# Patient Record
Sex: Male | Born: 1965 | Race: Black or African American | Smoking: Current every day smoker
Health system: Southern US, Community
[De-identification: ages and names within clinical notes are randomized; demographics above are authoritative.]

---

## 2013-05-09 ENCOUNTER — Other Ambulatory Visit (HOSPITAL_COMMUNITY): Payer: Self-pay | Admitting: Radiology

## 2013-05-09 ENCOUNTER — Ambulatory Visit (HOSPITAL_COMMUNITY): Payer: BC Managed Care – PPO | Attending: Orthopedic Surgery | Admitting: Radiology

## 2013-05-09 DIAGNOSIS — Z01810 Encounter for preprocedural cardiovascular examination: Secondary | ICD-10-CM

## 2013-05-09 DIAGNOSIS — R011 Cardiac murmur, unspecified: Secondary | ICD-10-CM

## 2013-05-09 NOTE — Progress Notes (Signed)
Echocardiogram performed.  

## 2013-05-13 ENCOUNTER — Telehealth: Payer: Self-pay | Admitting: *Deleted

## 2013-05-13 DIAGNOSIS — I77810 Thoracic aortic ectasia: Secondary | ICD-10-CM

## 2013-05-13 DIAGNOSIS — I350 Nonrheumatic aortic (valve) stenosis: Secondary | ICD-10-CM

## 2013-05-13 NOTE — Telephone Encounter (Signed)
Message copied by Barrie Folk on Mon May 13, 2013  9:29 AM ------      Message from: Laurey Morale      Created: Sun May 12, 2013  8:45 PM       Dilated ascending aorta with mild AS.  Needs CTA chest to further assess ascending aorta.  Does he have cardiology followup? ------

## 2013-05-13 NOTE — Telephone Encounter (Signed)
I spoke with pt about results of ECHO. He has a follow-up appointment with Dr. Eldridge Dace on 05/21/13 Charmaine notified for prior approval. We discussed the CTA. He will have that performed tomorrow in our office at 3:30pm Pt is aware he needs to be NPO  For 2 hours prior to CTA.   Mylo Red RN

## 2013-05-14 ENCOUNTER — Ambulatory Visit (INDEPENDENT_AMBULATORY_CARE_PROVIDER_SITE_OTHER)
Admission: RE | Admit: 2013-05-14 | Discharge: 2013-05-14 | Disposition: A | Discharge status: Home / Self Care | Payer: BC Managed Care – PPO | Source: Ambulatory Visit | Attending: Cardiology | Admitting: Cardiology

## 2013-05-14 ENCOUNTER — Telehealth: Payer: Self-pay | Admitting: *Deleted

## 2013-05-14 DIAGNOSIS — I77810 Thoracic aortic ectasia: Secondary | ICD-10-CM

## 2013-05-14 DIAGNOSIS — I350 Nonrheumatic aortic (valve) stenosis: Secondary | ICD-10-CM

## 2013-05-14 DIAGNOSIS — I359 Nonrheumatic aortic valve disorder, unspecified: Secondary | ICD-10-CM

## 2013-05-14 MED ORDER — IOHEXOL 350 MG/ML SOLN
100.0000 mL | Freq: Once | INTRAVENOUS | Status: AC | PRN
  Administered 2013-05-14: 100 mL via INTRAVENOUS

## 2013-05-14 NOTE — Telephone Encounter (Signed)
Tried calling Dr. Rachael Fee  office as per notes. Couldn't get an answer.

## 2013-05-21 ENCOUNTER — Ambulatory Visit (INDEPENDENT_AMBULATORY_CARE_PROVIDER_SITE_OTHER): Payer: BC Managed Care – PPO | Admitting: Interventional Cardiology

## 2013-05-21 ENCOUNTER — Encounter: Payer: Self-pay | Admitting: Interventional Cardiology

## 2013-05-21 ENCOUNTER — Encounter: Payer: Self-pay | Admitting: Cardiology

## 2013-05-21 VITALS — BP 168/92 | HR 100 | Ht 73.0 in | Wt 289.8 lb

## 2013-05-21 DIAGNOSIS — R9431 Abnormal electrocardiogram [ECG] [EKG]: Secondary | ICD-10-CM

## 2013-05-21 DIAGNOSIS — I712 Thoracic aortic aneurysm, without rupture, unspecified: Secondary | ICD-10-CM

## 2013-05-21 DIAGNOSIS — R011 Cardiac murmur, unspecified: Secondary | ICD-10-CM

## 2013-05-21 DIAGNOSIS — I10 Essential (primary) hypertension: Secondary | ICD-10-CM

## 2013-05-21 DIAGNOSIS — E669 Obesity, unspecified: Secondary | ICD-10-CM

## 2013-05-21 NOTE — Progress Notes (Signed)
Patient ID: Jason Miles, male   DOB: [birth year], 47 y.o.   MRN: [id number]     Patient ID: Jason Miles MRN: [id number] DOB/AGE: 01/18/1966 47 y.o.   Referring Physician Dr. Rennis Chris   Reason for Consultation preoperative clearance  HPI: 48 y/o who had shoulder surgery planned.  It was cancelled due to a heart murmur.  He had an echocardiogram showing mild AS and a dilated aortic root.  He smokes and is hoping to quit.  He failed chantix. He walks stairs and has no problems with that activity.    No exercise recently due to shoulder injury.  BP has ben high but he does not check at home.  He thinks his BP is higher in the MDs office than at home.     Current Outpatient Prescriptions  Medication Sig Dispense Refill  . amLODipine (NORVASC) 10 MG tablet Take 10 mg by mouth daily.       No current facility-administered medications for this visit.   No past medical history on file.  No family history on file.  History   Social History  . Marital Status: Divorced    Spouse Name: N/A    Number of Children: N/A  . Years of Education: N/A   Occupational History  . Not on file.   Social History Main Topics  . Smoking status: Not on file  . Smokeless tobacco: Not on file  . Alcohol Use: Not on file  . Drug Use: Not on file  . Sexual Activity: Not on file   Other Topics Concern  . Not on file   Social History Narrative  . No narrative on file    No past surgical history on file.    (Not in a hospital admission)  Review of systems complete and found to be negative unless listed above .  No nausea, vomiting.  No fever chills, No focal weakness,  No palpitations.  Physical Exam: Filed Vitals:   05/21/13 1426  BP: 168/92  Pulse: 100    Weight: 289 lb 12.8 oz (131.452 kg)  Physical exam:  [place]/AT EOMI No JVD, No carotid bruit RRR S1S2 , 2/6 systolic murmur No wheezing Soft. NT, nondistended No edema. No focal motor or sensory deficits Normal affect  Labs:   No  results found for this basename: WBC, HGB, HCT, MCV, PLT   No results found for this basename: NA, K, CL, CO2, BUN, CREATININE, CALCIUM, LABALBU, PROT, BILITOT, ALKPHOS, ALT, AST, GLUCOSE,  in the last 168 hours No results found for this basename: CKTOTAL, CKMB, CKMBINDEX, TROPONINI    No results found for this basename: CHOL   No results found for this basename: HDL   No results found for this basename: LDLCALC   No results found for this basename: TRIG   No results found for this basename: CHOLHDL   No results found for this basename: LDLDIRECT      Radiology:CT scan shows 4.3 cm ascending aortic aneurysm EKG: NSR, TWI laterally  ASSESSMENT AND PLAN:  Preoperative evaluation:  No further cardiac testing needed before surgery.  No ischemic sx with walking stairs.    Murmur: Aortic stenosis- mild by echo.    Dilated aortic root: Mild.  No need for surgery at this time.  Will need routine f/u.  Needs aggressive BP control.  Abnormal ECG: LVH noted on echo.  ECG changes are consistent with this.  HTN: Weight loss would be beneficial.  Check BP outside of the doctors office.   May  need to add ACE-I if BP continues to be high.   Signed:   Fredric Mare, MD, [facility] 05/21/2013, 2:41 PM

## 2013-05-21 NOTE — Patient Instructions (Addendum)
Your physician has requested that you regularly monitor and record your blood pressure readings at home about 3 times a week. Call if BP consistently stays elevated about 140/90. Please use the same machine at the same time of day to check your readings and record them to bring to your follow-up visit.  Your physician recommends that you schedule a follow-up appointment in: 3 weeks with Dr. Eldridge Dace.

## 2013-05-22 DIAGNOSIS — I712 Thoracic aortic aneurysm, without rupture: Secondary | ICD-10-CM | POA: Insufficient documentation

## 2013-05-22 DIAGNOSIS — I10 Essential (primary) hypertension: Secondary | ICD-10-CM | POA: Insufficient documentation

## 2013-05-22 DIAGNOSIS — R011 Cardiac murmur, unspecified: Secondary | ICD-10-CM | POA: Insufficient documentation

## 2013-05-22 DIAGNOSIS — R9431 Abnormal electrocardiogram [ECG] [EKG]: Secondary | ICD-10-CM | POA: Insufficient documentation

## 2013-05-22 DIAGNOSIS — E669 Obesity, unspecified: Secondary | ICD-10-CM | POA: Insufficient documentation

## 2013-06-13 ENCOUNTER — Ambulatory Visit (INDEPENDENT_AMBULATORY_CARE_PROVIDER_SITE_OTHER): Payer: BC Managed Care – PPO | Admitting: Interventional Cardiology

## 2013-06-13 ENCOUNTER — Encounter (INDEPENDENT_AMBULATORY_CARE_PROVIDER_SITE_OTHER): Payer: Self-pay

## 2013-06-13 ENCOUNTER — Encounter: Payer: Self-pay | Admitting: Interventional Cardiology

## 2013-06-13 VITALS — BP 142/92 | HR 92 | Ht 73.0 in | Wt 293.0 lb

## 2013-06-13 DIAGNOSIS — I359 Nonrheumatic aortic valve disorder, unspecified: Secondary | ICD-10-CM

## 2013-06-13 DIAGNOSIS — I712 Thoracic aortic aneurysm, without rupture, unspecified: Secondary | ICD-10-CM

## 2013-06-13 DIAGNOSIS — I10 Essential (primary) hypertension: Secondary | ICD-10-CM

## 2013-06-13 DIAGNOSIS — E669 Obesity, unspecified: Secondary | ICD-10-CM

## 2013-06-13 MED ORDER — LISINOPRIL 20 MG PO TABS: ORAL_TABLET | ORAL | Status: DC

## 2013-06-13 MED ORDER — AMLODIPINE BESYLATE 10 MG PO TABS: 10.0000 mg | ORAL_TABLET | Freq: Every day | ORAL | Status: DC

## 2013-06-13 NOTE — Progress Notes (Signed)
Patient ID: Jason Miles, male   DOB: [birth year], 48 y.o.   MRN: [id number] Patient ID: Jason BoehringerHenry Miles, male   DOB: [birth year], 48 y.o.   MRN: [id number]     Patient ID: Jason BoehringerHenry Miles MRN: [id number] DOB/AGE: [birth year] 48 y.o.   Referring Physician Dr. Rennis ChrisSupple   Reason for Consultation preoperative clearance  HPI: 48 y/o who had shoulder surgery planned.  It was cancelled due to a heart murmur.  He had an echocardiogram showing mild AS and a dilated aortic root.  He smokes and is hoping to quit.  He failed chantix. He walks stairs and has no problems with that activity.    No exercise recently due to shoulder injury.  BP has ben high but he does not check at home.  He thinks his BP is higher in the MDs office than at home.  Blood pressure medicines were changed. It does appear that he is taking amlodipine 10 mg twice a day. The second dose may not be that effective. An ACE inhibitor was started.   Current Outpatient Prescriptions  Medication Sig Dispense Refill  . amLODipine (NORVASC) 10 MG tablet Take 10 mg by mouth 2 (two) times daily.       Marland Kitchen. lisinopril (PRINIVIL,ZESTRIL) 10 MG tablet Take 10 mg by mouth daily.        No current facility-administered medications for this visit.   No past medical history on file.  Family History  Problem Relation Age of Onset  . Diabetes Mother   . Diabetes Father   . Diabetes Sister   . Diabetes Brother     History   Social History  . Marital Status: Divorced    Spouse Name: N/A    Number of Children: N/A  . Years of Education: N/A   Occupational History  . Not on file.   Social History Main Topics  . Smoking status: Current Every Day Smoker -- 0.50 packs/day for 25 years  . Smokeless tobacco: Not on file  . Alcohol Use: Not on file  . Drug Use: Not on file  . Sexual Activity: Not on file   Other Topics Concern  . Not on file   Social History Narrative  . No narrative on file    No past surgical history on file.    (Not in a  hospital admission)  Review of systems complete and found to be negative unless listed above .  No nausea, vomiting.  No fever chills, No focal weakness,  No palpitations.  Physical Exam: Filed Vitals:   06/13/13 1436  BP: 142/92  Pulse: 92    Weight: 293 lb (132.904 kg)  Physical exam:  [place]/AT EOMI No JVD, No carotid bruit RRR S1S2 , 2/6 systolic murmur No wheezing Soft. NT, nondistended No edema. No focal motor or sensory deficits Normal affect  Labs:   No results found for this basename: WBC,  HGB,  HCT,  MCV,  PLT   No results found for this basename: NA, K, CL, CO2, BUN, CREATININE, CALCIUM, LABALBU, PROT, BILITOT, ALKPHOS, ALT, AST, GLUCOSE,  in the last 168 hours No results found for this basename: CKTOTAL,  CKMB,  CKMBINDEX,  TROPONINI    No results found for this basename: CHOL   No results found for this basename: HDL   No results found for this basename: LDLCALC   No results found for this basename: TRIG   No results found for this basename: CHOLHDL   No results found for this basename: LDLDIRECT  Radiology:CT scan shows 4.3 cm ascending aortic aneurysm EKG: NSR, TWI laterally  ASSESSMENT AND PLAN:  Preoperative evaluation:  No further cardiac testing needed before surgery.  No ischemic sx with walking stairs.  Shoulder surgery is tomorrow  Murmur: Aortic stenosis- mild by echo.    Dilated aortic root: Mild.  No need for surgery at this time.  Will need routine f/u.  Needs aggressive BP control.  Needs f/u imaging in 1 year, Dec 2015 to look at aortic root.  Abnormal ECG: LVH noted on echo.  ECG changes are consistent with this.  HTN: Weight loss would be beneficial.  Check BP outside of the doctors office.   Lisinopril   was added a couple of weeks ago.  I would decrease amlodipine to 10 mg daily.  Can increase lisinopril to 20 mg daily for better BP control.   Signed:   Fredric Mare, MD, [facility] 06/13/2013, 3:05 PM

## 2013-06-13 NOTE — Patient Instructions (Signed)
Your physician has recommended you make the following change in your medication:   1. Decrease Amlodipine to $RemoveBefore EID_[id number]$10mgYour physician wants you to follow-up in: 3 months with Dr. Eldridge DaceVaranasi. You will receive a reminder letter in the mail two months in advance. If you don't receive a letter, please call our office to schedule the follow-up appointment.

## 2013-10-11 ENCOUNTER — Ambulatory Visit (INDEPENDENT_AMBULATORY_CARE_PROVIDER_SITE_OTHER): Payer: BC Managed Care – PPO | Admitting: Interventional Cardiology

## 2013-10-11 ENCOUNTER — Encounter: Payer: Self-pay | Admitting: Interventional Cardiology

## 2013-10-11 VITALS — BP 142/90 | HR 90 | Ht 73.0 in | Wt 297.0 lb

## 2013-10-11 DIAGNOSIS — I359 Nonrheumatic aortic valve disorder, unspecified: Secondary | ICD-10-CM

## 2013-10-11 DIAGNOSIS — Z79899 Other long term (current) drug therapy: Secondary | ICD-10-CM

## 2013-10-11 DIAGNOSIS — I10 Essential (primary) hypertension: Secondary | ICD-10-CM

## 2013-10-11 DIAGNOSIS — I712 Thoracic aortic aneurysm, without rupture, unspecified: Secondary | ICD-10-CM

## 2013-10-11 MED ORDER — LISINOPRIL 40 MG PO TABS: ORAL_TABLET | ORAL | Status: DC

## 2013-10-11 NOTE — Patient Instructions (Signed)
Your physician recommends that you return for lab work in: 1 week on 10/18/13 for BMET.  Your physician has recommended you make the following change in your medication:   1. Increase Lisinopril to 40 mg daily.   Your physician recommends that you schedule a follow-up appointment in: 4 months with Dr. Eldridge Dace.

## 2013-10-11 NOTE — Progress Notes (Signed)
Patient ID: Jason Miles, male   DOB: April 14, 1966, 48 y.o.   MRN: [id number] Patient ID: Jason Miles, male   DOB: April 14, 1966, 48 y.o.   MRN: [id number] Patient ID: Jason Miles, male   DOB: April 14, 1966, 48 y.o.   MRN: [id number]     Patient ID: Jason Miles MRN: [id number] DOB/AGE: April 14, 1966 48 y.o.   Referring Physician Dr. Rennis ChrisSupple   Reason for Consultation preoperative clearance  HPI: 48 y/o who had shoulder surgery planned.  It was cancelled due to a heart murmur.  He had an echocardiogram showing mild AS and a dilated aortic root.  He smokes and is hoping to quit.  He failed chantix. He walks stairs and has no problems with that activity.    He had his shoulder surgery a few months ago.  He thinks his BP has been in the 140-150 range sine that time.  No exercise recently due to knee pain.  BP has been high at the dentist office  but he does not check at home.     Current Outpatient Prescriptions  Medication Sig Dispense Refill  . amLODipine (NORVASC) 10 MG tablet Take 1 tablet (10 mg total) by mouth daily.  30 tablet  6  . amoxicillin (AMOXIL) 500 MG tablet Take 500 mg by mouth 2 (two) times daily.      Marland Kitchen. aspirin 81 MG tablet Take 81 mg by mouth daily.      . Hydrocodone-Acetaminophen 5-300 MG TABS Take 5-300 tablets by mouth 2 (two) times daily.      Marland Kitchen. lisinopril (PRINIVIL,ZESTRIL) 20 MG tablet 1 tablet by mouth daily  30 tablet  6  . naproxen (NAPROSYN) 500 MG tablet Take 500 mg by mouth 2 (two) times daily with a meal.      . Omega-3 Fatty Acids (FISH OIL) 1000 MG CAPS Take 1,000 capsules by mouth 2 (two) times daily.       No current facility-administered medications for this visit.   No past medical history on file.  Family History  Problem Relation Age of Onset  . Diabetes Mother   . Diabetes Father   . Diabetes Sister   . Diabetes Brother     History   Social History  . Marital Status: Divorced    Spouse Name: N/A    Number of Children: N/A  . Years of Education: N/A    Occupational History  . Not on file.   Social History Main Topics  . Smoking status: Current Every Day Smoker -- 0.50 packs/day for 25 years  . Smokeless tobacco: Not on file  . Alcohol Use: Not on file  . Drug Use: Not on file  . Sexual Activity: Not on file   Other Topics Concern  . Not on file   Social History Narrative  . No narrative on file    No past surgical history on file.    (Not in a hospital admission)  Review of systems complete and found to be negative unless listed above .  No nausea, vomiting.  No fever chills, No focal weakness,  No palpitations.  Physical Exam: Filed Vitals:   10/11/13 1621  BP: 142/90  Pulse: 90    Weight: 297 lb (134.718 kg)  Physical exam:  [place]/AT EOMI No JVD, No carotid bruit RRR S1S2 , 2/6 systolic murmur No wheezing Soft. NT, nondistended No edema. No focal motor or sensory deficits Normal affect  Labs:   No results found for this basename: WBC,  HGB,  HCT,  MCV,  PLT   No results found for this basename: NA, K, CL, CO2, BUN, CREATININE, CALCIUM, LABALBU, PROT, BILITOT, ALKPHOS, ALT, AST, GLUCOSE,  in the last 168 hours No results found for this basename: CKTOTAL,  CKMB,  CKMBINDEX,  TROPONINI    No results found for this basename: CHOL   No results found for this basename: HDL   No results found for this basename: LDLCALC   No results found for this basename: TRIG   No results found for this basename: CHOLHDL   No results found for this basename: LDLDIRECT      Radiology:CT scan shows 4.3 cm ascending aortic aneurysm EKG: NSR, TWI laterally  ASSESSMENT AND PLAN:   Tolerated shoulder surgery.  Murmur: Aortic stenosis- mild by echo.    Dilated aortic root: Mild.  No need for surgery at this time.  Will need routine f/u.  Needs aggressive BP control.  Needs f/u imaging in 1 year, Dec 2015 to look at aortic root.  Abnormal ECG: LVH noted on echo.  ECG changes are consistent with this.  HTN: Weight loss  would be beneficial.  Check BP outside of the doctors office.   Lisinopril   was added a couple of weeks ago.  Continue amlodipine  10 mg daily.  Increase lisinopril to 40 mg daily for better BP control.    BMet in one week. Recheck 144/84.  Signed:   Fredric Mare, MD, [facility] 10/11/2013, 4:50 PM

## 2013-10-18 ENCOUNTER — Other Ambulatory Visit (INDEPENDENT_AMBULATORY_CARE_PROVIDER_SITE_OTHER): Payer: BC Managed Care – PPO

## 2013-10-18 DIAGNOSIS — Z79899 Other long term (current) drug therapy: Secondary | ICD-10-CM

## 2013-10-18 LAB — BASIC METABOLIC PANEL
BUN: 12 mg/dL (ref 6–23)
CHLORIDE: 106 meq/L (ref 96–112)
CO2: 25 meq/L (ref 19–32)
CREATININE: 0.9 mg/dL (ref 0.4–1.5)
Calcium: 9.5 mg/dL (ref 8.4–10.5)
GFR: 119.55 mL/min (ref >60.00)
Glucose, Bld: 94 mg/dL (ref 70–99)
Potassium: 3.5 mEq/L (ref 3.5–5.1)
Sodium: 139 mEq/L (ref 135–145)

## 2013-10-23 ENCOUNTER — Telehealth: Payer: Self-pay | Admitting: Cardiovascular Disease

## 2013-10-23 NOTE — Telephone Encounter (Signed)
Patient was given a script for Crestor

## 2014-01-13 ENCOUNTER — Telehealth: Payer: Self-pay | Admitting: Interventional Cardiology

## 2014-01-13 DIAGNOSIS — R079 Chest pain, unspecified: Secondary | ICD-10-CM

## 2014-01-13 DIAGNOSIS — R0600 Dyspnea, unspecified: Secondary | ICD-10-CM

## 2014-01-13 NOTE — Telephone Encounter (Signed)
LMTCB

## 2014-01-13 NOTE — Telephone Encounter (Signed)
New Message  Pt called states that he has Pain on his left side// pain going down left arm onto left hand// and that he is breaking out into hives// please call back to assist

## 2014-01-14 MED ORDER — IRBESARTAN 150 MG PO TABS: 150.0000 mg | ORAL_TABLET | Freq: Every day | ORAL | Status: DC

## 2014-01-14 MED ORDER — AMLODIPINE BESYLATE 10 MG PO TABS: 10.0000 mg | ORAL_TABLET | Freq: Every day | ORAL | Status: AC

## 2014-01-14 NOTE — Telephone Encounter (Signed)
COntinue to monitor.  Was this the same arm that was operated on?  Any problems with exertion?

## 2014-01-14 NOTE — Telephone Encounter (Signed)
Pt notified, meds updated and [facility] will call pt to schedule Gxt.

## 2014-01-14 NOTE — Telephone Encounter (Signed)
Pt states he had an episode about 8PM last night. Pt states he was asleep  had chest pain on left side down his left arm. Pt states he changed beds and went back to sleep. He woke up about 11PM and the pain was resolved.   Pt states this has been off and on for several days. Pt saw his PCP yesterday and an EKG was done.  Pt states EKG was faxed to our office yesterday about 4PM.

## 2014-01-14 NOTE — Telephone Encounter (Signed)
Pt states PCPs gave pt prescription for Livalo yesterday but no other recommendations.  An appt was made for pt 01/23/14 with Dr Eldridge Dace.  I will forward to Dr Eldridge Dace / Amy for review.

## 2014-01-14 NOTE — Telephone Encounter (Signed)
Spoke with pt and he has intermittent CP and SOB with exertion. He actually tries to limit walking because of symptoms. Pt had the operation on his right side (shoulder) and the pain is on the left side through upper chest. His arm will go limp/numb. He will also experience some sweating around his neck with the symptoms. Pt is at work today and he has some left sided chest pain/arm pain, but it is better than yesterday. Pt has a dry cough, but sometimes he will cough up phlegm.

## 2014-01-14 NOTE — Telephone Encounter (Signed)
LMTCB

## 2014-01-14 NOTE — Telephone Encounter (Signed)
Per Dr. Eldridge Dace stop lisinopril and start Avapro 150 mg 1 tablet daily. Order Gxt for exertional CP and dyspnea.

## 2014-01-23 ENCOUNTER — Ambulatory Visit: Payer: BC Managed Care – PPO | Admitting: Interventional Cardiology

## 2014-02-05 ENCOUNTER — Ambulatory Visit: Payer: BC Managed Care – PPO | Admitting: Nurse Practitioner

## 2014-02-05 DIAGNOSIS — R0600 Dyspnea, unspecified: Secondary | ICD-10-CM

## 2014-02-05 DIAGNOSIS — R079 Chest pain, unspecified: Secondary | ICD-10-CM

## 2014-02-05 NOTE — Progress Notes (Signed)
Patient ID: Jason Miles, male   DOB: 08/19/1965, 48 y.o.   MRN: [id number]   Patient presents today for a GXT. Referred by Dr. Eldridge Dace.   He has knee pain, he is limping into the office. He is not able to exercise.   BP 140/100.  Resting EKG with poor R wave progression.   In light of knee pain, inability to walk on treadmill and abnormal EKG - will refer on for Lexiscan.  Patient is agreeable to this plan and will call if any problems develop in the interim.   Rosalio Macadamia, RN, ANP-C [facility] [address] [place], [place]  [id number] [phone]

## 2014-02-19 ENCOUNTER — Ambulatory Visit (HOSPITAL_COMMUNITY): Payer: BC Managed Care – PPO | Attending: Cardiovascular Disease | Admitting: Radiology

## 2014-02-19 VITALS — BP 143/86 | HR 77 | Ht 73.0 in | Wt 272.0 lb

## 2014-02-19 DIAGNOSIS — R079 Chest pain, unspecified: Secondary | ICD-10-CM

## 2014-02-19 DIAGNOSIS — R0609 Other forms of dyspnea: Secondary | ICD-10-CM

## 2014-02-19 DIAGNOSIS — I714 Abdominal aortic aneurysm, without rupture, unspecified: Secondary | ICD-10-CM | POA: Insufficient documentation

## 2014-02-19 DIAGNOSIS — I10 Essential (primary) hypertension: Secondary | ICD-10-CM | POA: Insufficient documentation

## 2014-02-19 DIAGNOSIS — R0600 Dyspnea, unspecified: Secondary | ICD-10-CM

## 2014-02-19 DIAGNOSIS — R0989 Other specified symptoms and signs involving the circulatory and respiratory systems: Secondary | ICD-10-CM

## 2014-02-19 MED ORDER — REGADENOSON 0.4 MG/5ML IV SOLN
0.4000 mg | Freq: Once | INTRAVENOUS | Status: AC
  Administered 2014-02-19: 0.4 mg via INTRAVENOUS

## 2014-02-19 MED ORDER — TECHNETIUM TC 99M SESTAMIBI GENERIC - CARDIOLITE
33.0000 | Freq: Once | INTRAVENOUS | Status: AC | PRN
  Administered 2014-02-19: 33 via INTRAVENOUS

## 2014-02-19 NOTE — Progress Notes (Signed)
[facility] SITE 3 NUCLEAR MED [address] [place], [place] [address] [phone]    Cardiology Nuclear Med Study  Jason Miles is a 48 y.o. male     MRN : [id number]     DOB: 02-Aug-1965  Procedure Date: 02/19/2014  Nuclear Med Background Indication for Stress Test:  [facility] Abnormal [facility]KG[facility] with Abdominal Pain, hematuria History:  No known CAD Cardiac Risk Factors: Hypertension and AAA 4.3cm  Symptoms:  Chest Pain (last date of chest discomfort was yesterday)   Nuclear Pre-Procedure Caffeine/Decaff Intake:  None> 12 hrs NPO After: 5:00pm   Lungs:  clear O2 Sat: 98% on room air. IV 0.9% NS with Angio Cath:  22g  IV Site: R Antecubital x 1, tolerated well IV Started by:  Irean HongPatsy Edwards, RN  Chest Size (in):  52 Cup Size: n/a  Height: 6\' 1"  (1.854 m)  Weight:  272 lb (123.378 kg)  BMI:  Body mass index is 35.89 kg/(m^2). Tech Comments:  Patient took Norvasc this am. Irean HongPatsy Edwards, RN.    Nuclear Med Study 1 or 2 day study: 2 day  Stress Test Type:  Eugenie BirksLexiscan  Reading MD: N/A  Order Authorizing Provider:  Kristeen MissPhilip Veatrice Eckstein, MD  Resting Radionuclide: Technetium 5478m Sestamibi  Resting Radionuclide Dose: 33.0 on 02/19/2014 mCi   Stress Radionuclide:  Technetium 8878m Sestamibi  Stress Radionuclide Dose: 33.0 on 02/19/2014 mCi           Stress Protocol Rest HR: 77 Stress HR: 103  Rest BP: 143/86 Stress BP: 116/78  Exercise Time (min): n/a METS: n/a           Dose of Adenosine (mg):  n/a Dose of Lexiscan: 0.4 mg  Dose of Atropine (mg): n/a Dose of Dobutamine: n/a mcg/kg/min (at max HR)  Stress Test Technologist: Nelson ChimesSharon Brooks, BS-ES  Nuclear Technologist:  Harlow AsaElizabeth Young, CNMT     Rest Procedure:  Myocardial perfusion imaging was performed at rest 45 minutes following the intravenous administration of Technetium 1078m Sestamibi. Rest ECG: NSR with non-specific ST-T wave changes  Stress Procedure:  The patient received  IV Lexiscan 0.4 mg over 15-seconds.  Technetium 6178m Sestamibi injected at 30-seconds.  Quantitative spect images were obtained after a 45 minute delay. Stress ECG: No significant change from baseline ECG  QPS Raw Data Images:  Normal; no motion artifact; normal heart/lung ratio. Stress Images:  There is a large moderate area of attenuation involving the lateral wall and inferior basal walls.  Rest Images:  There is a small area of mild attenuation of the inferior base. Subtraction (SDS):  There is evidence of reversible ischemi in the lateral wall.  Transient Ischemic Dilatation (Normal <1.22):  0.89 Lung/Heart Ratio (Normal <0.45):  0.31  Quantitative Gated Spect Images QGS EDV:  182 ml QGS ESV:  94 ml  Impression Exercise Capacity:  Lexiscan with no exercise. BP Response:  Normal blood pressure response. Clinical Symptoms:  No significant symptoms noted. ECG Impression:  No significant ST segment change suggestive of ischemia. Comparison with Prior Nuclear Study: No images to compare  Overall Impression:  Intermediate risk stress nuclear study .  There is evidence of lateral ischemia.  The EF appears to be mildly reduced with hypokinesis of the inferolateral wall.      .  LV Ejection Fraction: 48%.  LV Wall Motion:  Hypokinesis of the inferiolateral wall    Vesta MixerPhilip J. Carlous Olivares, Montez HagemanJr., MD, [facility] 02/27/2014, 5:59 PM [address],  [address] Office [phone] Pager [phone]

## 2014-02-27 ENCOUNTER — Ambulatory Visit (HOSPITAL_COMMUNITY): Payer: BC Managed Care – PPO

## 2014-02-27 DIAGNOSIS — R0789 Other chest pain: Secondary | ICD-10-CM

## 2014-02-27 MED ORDER — TECHNETIUM TC 99M SESTAMIBI GENERIC - CARDIOLITE
30.0000 | Freq: Once | INTRAVENOUS | Status: AC | PRN
  Administered 2014-02-27: 30 via INTRAVENOUS

## 2014-03-10 ENCOUNTER — Ambulatory Visit (INDEPENDENT_AMBULATORY_CARE_PROVIDER_SITE_OTHER): Payer: BC Managed Care – PPO | Admitting: Nurse Practitioner

## 2014-03-10 ENCOUNTER — Encounter: Payer: Self-pay | Admitting: Nurse Practitioner

## 2014-03-10 VITALS — BP 140/88 | HR 75 | Ht 73.0 in | Wt 277.4 lb

## 2014-03-10 DIAGNOSIS — R931 Abnormal findings on diagnostic imaging of heart and coronary circulation: Secondary | ICD-10-CM

## 2014-03-10 DIAGNOSIS — R9439 Abnormal result of other cardiovascular function study: Secondary | ICD-10-CM

## 2014-03-10 DIAGNOSIS — R9431 Abnormal electrocardiogram [ECG] [EKG]: Secondary | ICD-10-CM

## 2014-03-10 MED ORDER — NITROGLYCERIN 0.4 MG SL SUBL: 0.4000 mg | SUBLINGUAL_TABLET | SUBLINGUAL | Status: AC | PRN

## 2014-03-10 MED ORDER — ISOSORBIDE MONONITRATE ER 30 MG PO TB24: 30.0000 mg | ORAL_TABLET | Freq: Every day | ORAL | Status: DC

## 2014-03-10 NOTE — Progress Notes (Signed)
Jason Miles Date of Birth: 06/12/1965 Medical Record #[id number]  History of Present Illness: Jason Miles is seen back today for a follow up visit. Seen for Dr. Eldridge DaceVaranasi. Jason Miles is a 48 year old male with HTN and ongoing tobacco abuse.   Seen here back in May - was for shoulder surgery but cancelled due to heart murmur being detected - echo showed mild AS with a dilated aortic root. BP high. Referred on for GXT after calling here at the end of August with chest pain and shortness of breath but was not able to walk on the treadmill - was limping with knee pain - referred on for Myoview - results below.  Comes in today. Here alone. Jason Miles has some intermittent left chest pain that radiates to his left arm - mostly exertional - relieved with rest. Notes that if Jason Miles paces his activity Jason Miles will have less pain. Still smoking. BP has improved.   Current Outpatient Prescriptions  Medication Sig Dispense Refill  . amLODipine (NORVASC) 10 MG tablet Take 1 tablet (10 mg total) by mouth daily.  30 tablet  6  . aspirin 81 MG tablet Take 81 mg by mouth daily.      . Hydrocodone-Acetaminophen 5-300 MG TABS Take 5-325 tablets by mouth 3 (three) times daily.       Marland Kitchen. ibuprofen (ADVIL,MOTRIN) 600 MG tablet Take 600 mg by mouth 3 (three) times daily.      . irbesartan (AVAPRO) 150 MG tablet Take 1 tablet (150 mg total) by mouth daily.  30 tablet  6  . metoprolol succinate (TOPROL-XL) 25 MG 24 hr tablet Take 25 mg by mouth daily.      . Pitavastatin Calcium (LIVALO) 2 MG TABS Take 2 mg by mouth daily.       No current facility-administered medications for this visit.    Allergies  Allergen Reactions  . Codeine Other (See Comments)    Nosebleeds and elevated BP  . Ibuprofen Other (See Comments)    Nose bleeds and elevated BP  . Lisinopril Cough    No past medical history on file.  No past surgical history on file.  History  Smoking status  . Current Every Day Smoker -- 0.50 packs/day for 25 years  Smokeless  tobacco  . Not on file    History  Alcohol Use: Not on file    Family History  Problem Relation Age of Onset  . Diabetes Mother   . Diabetes Father   . Diabetes Sister   . Diabetes Brother     Review of Systems: The review of systems is per the HPI.  All other systems were reviewed and are negative.  Physical Exam: BP 140/88  Pulse 75  Ht 6\' 1"  (1.854 m)  Wt 277 lb 6.4 oz (125.828 kg)  BMI 36.61 kg/m2 Patient is very pleasant and in no acute distress. Jason Miles is obese. Skin is warm and dry. Color is normal.  HEENT is unremarkable. Normocephalic/atraumatic. PERRL. Sclera are nonicteric. Neck is supple. No masses. No JVD. Lungs are clear. Cardiac exam shows a regular rate and rhythm. Outflow murmur noted.  Abdomen is soft. Extremities are without edema. Gait and ROM are intact. No gross neurologic deficits noted.  Wt Readings from Last 3 Encounters:  03/10/14 277 lb 6.4 oz (125.828 kg)  02/19/14 272 lb (123.378 kg)  10/11/13 297 lb (134.718 kg)    LABORATORY DATA/PROCEDURES: EKG today with sinus - diffuse T wave changes, septal infarct with LVH  Lab Results  Component Value Date   GLUCOSE 94 10/18/2013   NA 139 10/18/2013   K 3.5 10/18/2013   CL 106 10/18/2013   CREATININE 0.9 10/18/2013   BUN 12 10/18/2013   CO2 25 10/18/2013    BNP (last 3 results) No results found for this basename: PROBNP,  in the last 8760 hours  Myoview Impression from October 2015 Exercise Capacity: Lexiscan with no exercise.  BP Response: Normal blood pressure response.  Clinical Symptoms: No significant symptoms noted.  ECG Impression: No significant ST segment change suggestive of ischemia.  Comparison with Prior Nuclear Study: No images to compare  Overall Impression: Intermediate risk stress nuclear study . There is evidence of lateral ischemia. The EF appears to be mildly reduced with hypokinesis of the inferolateral wall. .  LV Ejection Fraction: 48%. LV Wall Motion: Hypokinesis of the  inferiolateral wall  Jason Miles, Jason HagemanJr., Jason Miles, Jason Miles  02/27/2014, 5:59 PM  [address]  Office [phone]  Pager [phone]- [phone]   Echo Study Conclusions from December 2014  - Left ventricle: The cavity size was normal. Wall thickness was increased in a pattern of severe LVH. Systolic function was normal. The estimated ejection fraction was in the range of 55% to 60%. Wall motion was normal; there were no regional wall motion abnormalities. Doppler parameters are consistent with abnormal left ventricular relaxation (grade 1 diastolic dysfunction). - Aortic valve: There was mild stenosis. Trivial regurgitation. - Aortic root: The aortic root was mildly dilated. - Left atrium: The atrium was mildly dilated. Impressions:  - Ascending aorta measures 4.5 cm; suggest CTA or MRA to further assess.  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  No other intra thoracic abnormalities.  Tiny nonobstructing calculus at upper pole right kidney.  Electronically Signed  By: Jason SouthwardMark Boles M.D.  On: 05/14/2013 16:16    Assessment / Plan: 1. Abnormal Myoview  2. HTN  3. Tobacco abuse  4. Dilated aortic root - needs imaging annually - due in December  5. Tobacco abuse - not ready to stop  6. Chest pain  Discussed his Myoview results and the need for cardiac cath - I have reviewed the procedure, risks and benefits and Jason Miles is NOT willing to proceed. Tells me that his 1st wife died at age 47 during a cath. Jason Miles would like to try medicines first. Adding Imdur 30 mg a day and RX for sl NTG. See Dr. Eldridge DaceVaranasi in 2 to 3 weeks for further discussion.   Patient is agreeable to this plan and will call if any problems develop in the interim.   Jason MacadamiaLori C. Edvardo Honse, Jason Miles, Jason Miles [facility] [address] [place], [place]  [id number] [phone]

## 2014-03-10 NOTE — Patient Instructions (Signed)
Stay on your current medicines  I am adding Imdur 30 mg each - this is a long acting form of NTG to help prevent chest pain.  I am also adding NTG sl (under the tongue) to use if needed  Use your NTG under your tongue for recurrent chest pain. May take one tablet every 5 minutes. If you are still having discomfort after 3 tablets in 15 minutes, call [phone].  See Dr. Eldridge DaceVaranasi in 2 to 3 weeks  Try to stop smoking  Call the [facility] office at [phone] if you have any questions, problems or concerns.

## 2014-03-31 ENCOUNTER — Encounter: Payer: BC Managed Care – PPO | Admitting: Interventional Cardiology

## 2014-04-10 ENCOUNTER — Encounter: Payer: Self-pay | Admitting: Interventional Cardiology

## 2014-05-02 ENCOUNTER — Telehealth: Payer: Self-pay | Admitting: *Deleted

## 2014-05-02 NOTE — Telephone Encounter (Signed)
[facility]w [facility] stated needed all test results on pt sent to fax # [phone]

## 2014-07-22 ENCOUNTER — Other Ambulatory Visit: Payer: Self-pay | Admitting: Interventional Cardiology

## 2014-09-06 ENCOUNTER — Other Ambulatory Visit: Payer: Self-pay | Admitting: Nurse Practitioner

## 2014-11-11 ENCOUNTER — Other Ambulatory Visit: Payer: Self-pay | Admitting: Nurse Practitioner

## 2014-12-01 ENCOUNTER — Other Ambulatory Visit: Payer: Self-pay | Admitting: Interventional Cardiology

## 2014-12-01 ENCOUNTER — Other Ambulatory Visit: Payer: Self-pay | Admitting: Nurse Practitioner

## 2015-11-13 IMAGING — CT CT ANGIO CHEST
2 of 5 series · 19 of 36 positions shown · IV contrast (Omnipaque 300)
Comparison: None.

CLINICAL DATA: Ascending aortic dilatation, aortic stenosis

EXAM:
CT ANGIOGRAPHY CHEST WITH CONTRAST
TECHNIQUE: Multidetector CT imaging of the chest was performed using the
standard protocol during bolus administration of intravenous
contrast. Multiplanar CT image reconstructions including MIPs were
obtained to evaluate the vascular anatomy.
CONTRAST:  100mL OMNIPAQUE IOHEXOL 350 MG/ML SOLN

[Series 4: cta chest w/cm 3mm · axial · 0.83mm/px · z∈[-337,-19]mm · 18 of 116 slices shown]
[im 5/116  lung]
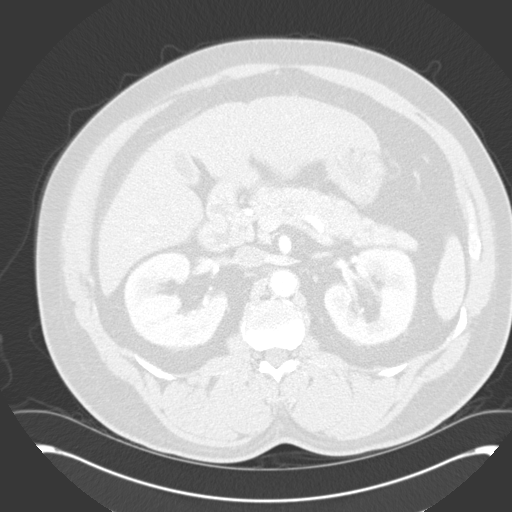
[im 13/116  mediastinal]
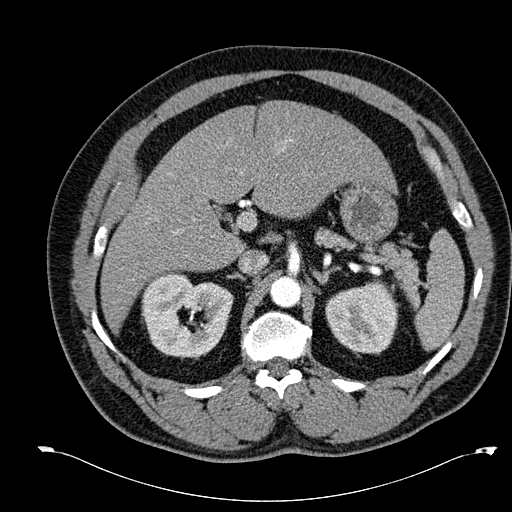
[im 18/116  lung]
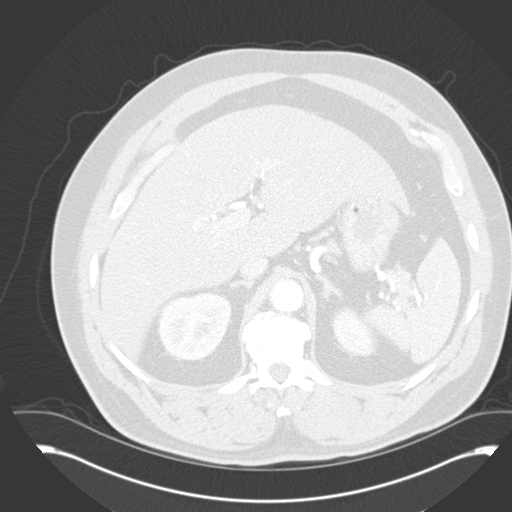
[im 26/116  mediastinal]
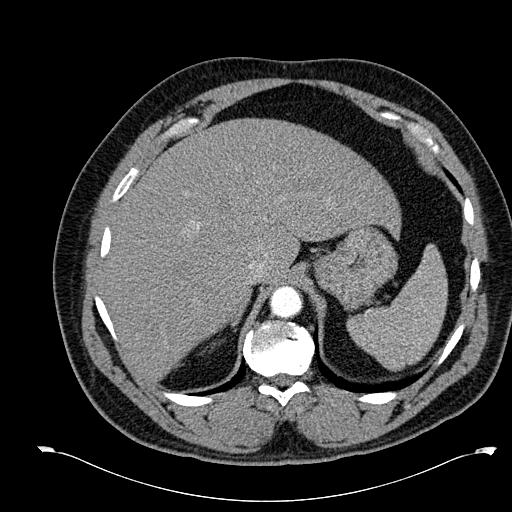
[im 30/116  lung]
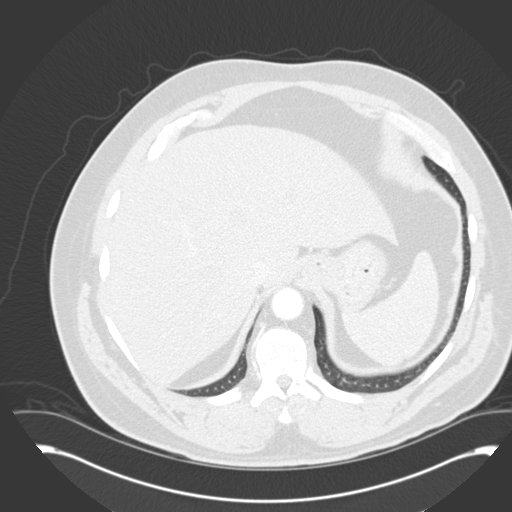
[im 35/116  mediastinal]
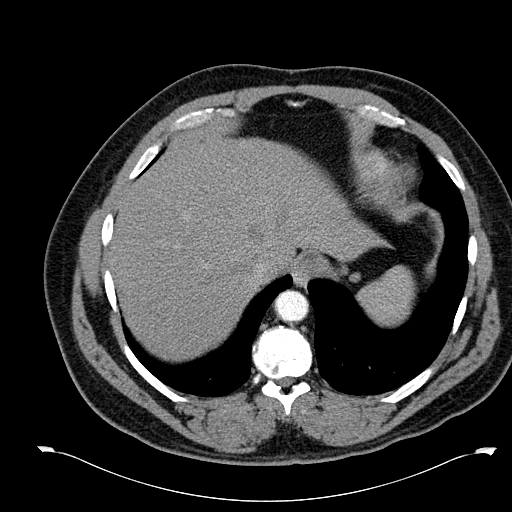
[im 43/116  lung]
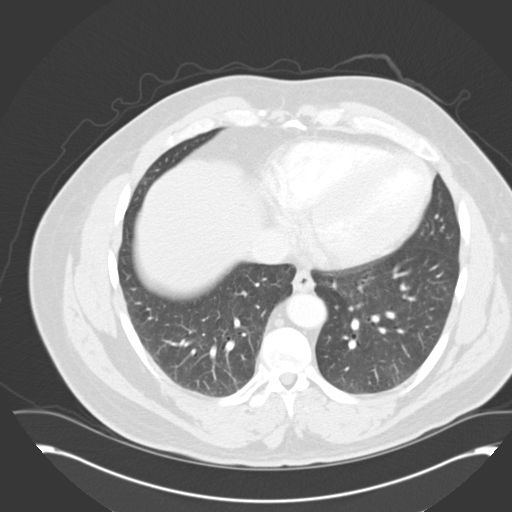
[im 47/116  mediastinal]
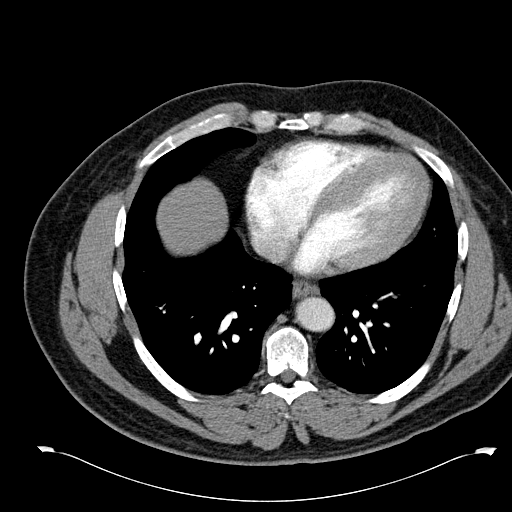
[im 56/116  lung]
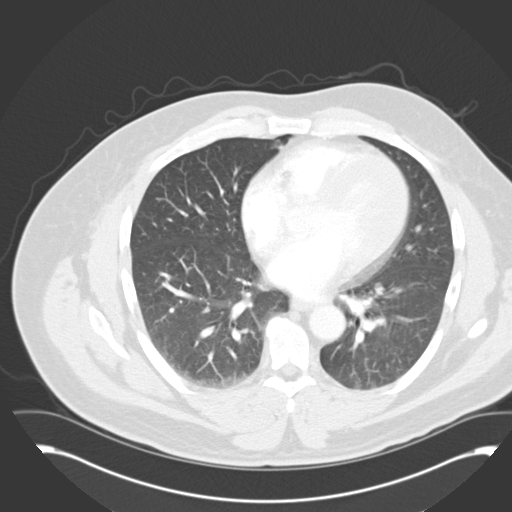
[im 60/116  mediastinal]
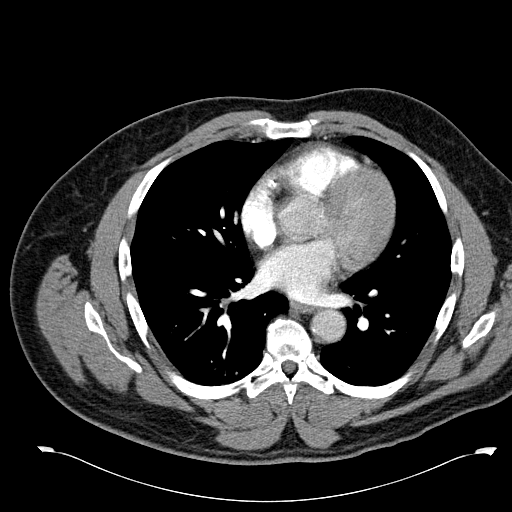
[im 69/116  lung]
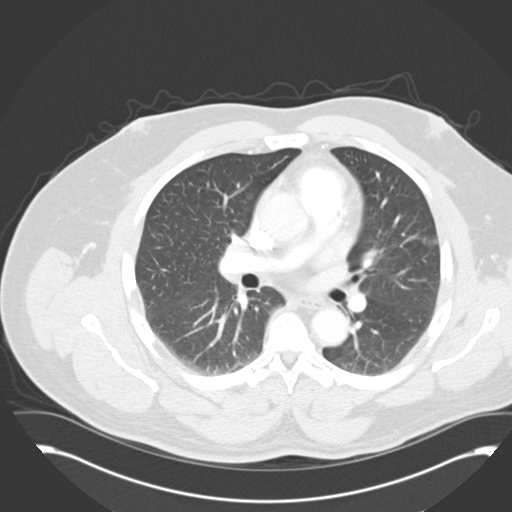
[im 73/116  mediastinal]
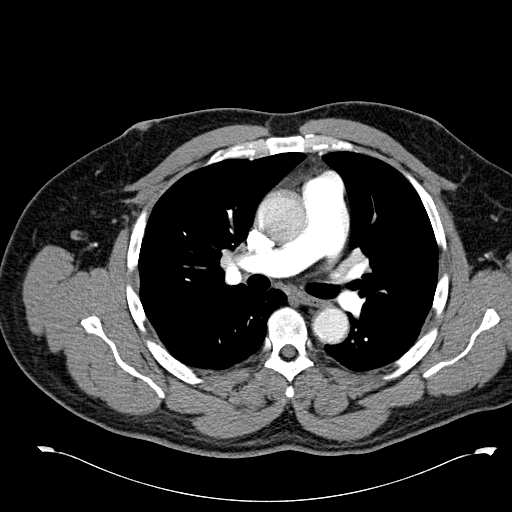
[im 81/116  lung]
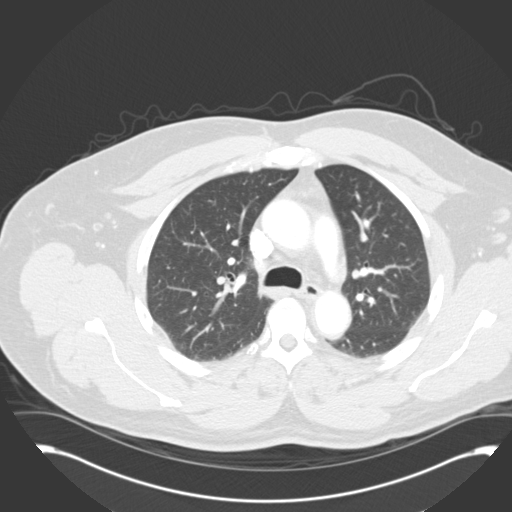
[im 86/116  mediastinal]
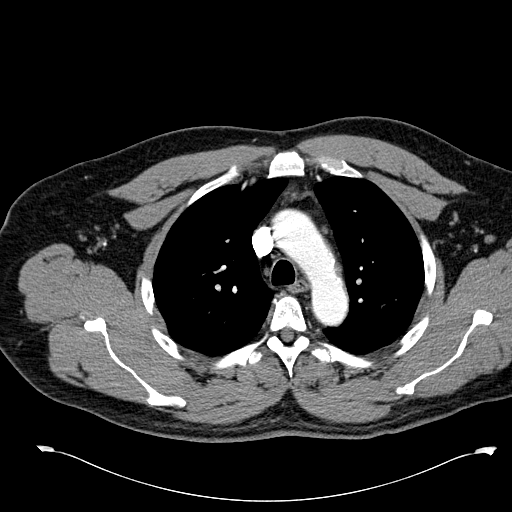
[im 90/116  lung]
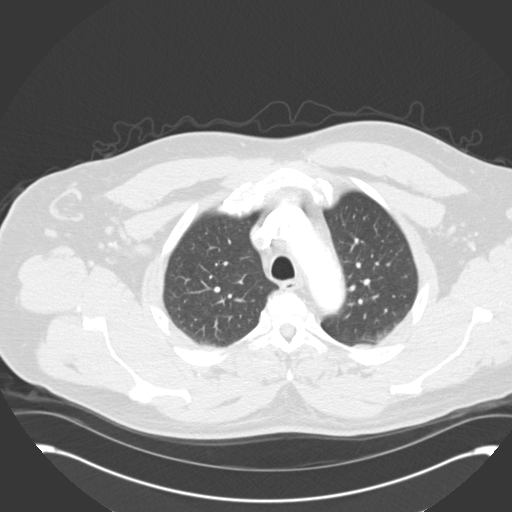
[im 98/116  mediastinal]
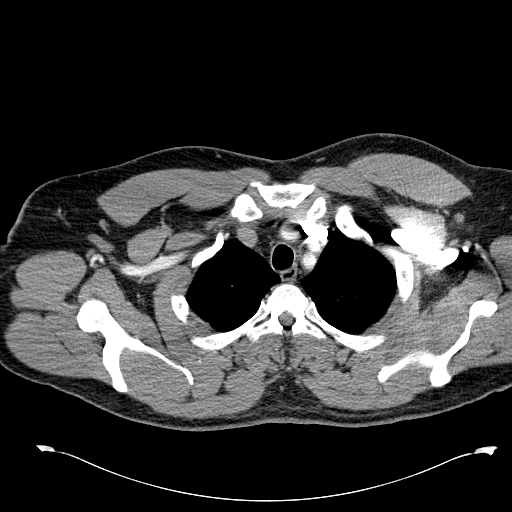
[im 103/116  lung]
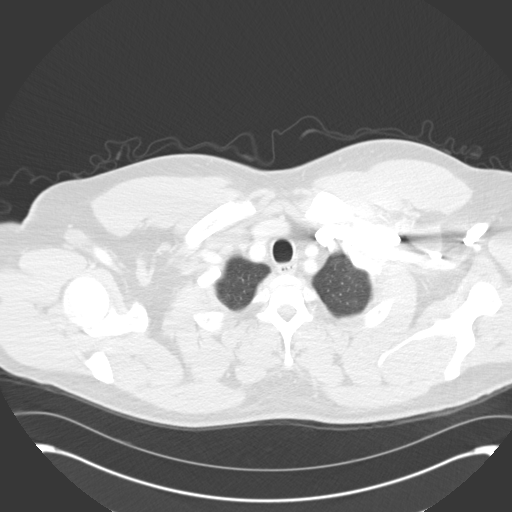
[im 111/116  mediastinal]
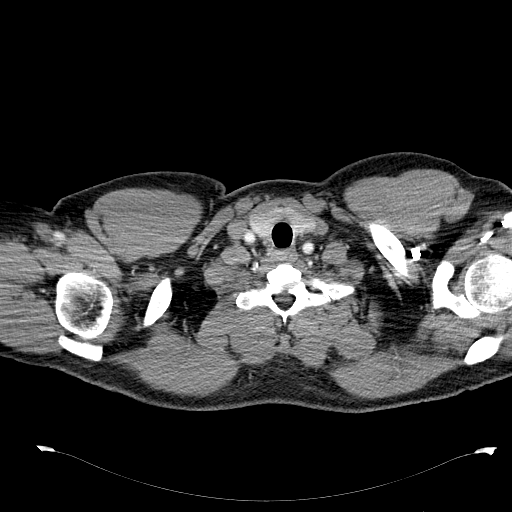

[Series 602: cor mpr · coronal · 0.83mm/px · 1 of 120 slices shown]
[im 60/120  mediastinal]
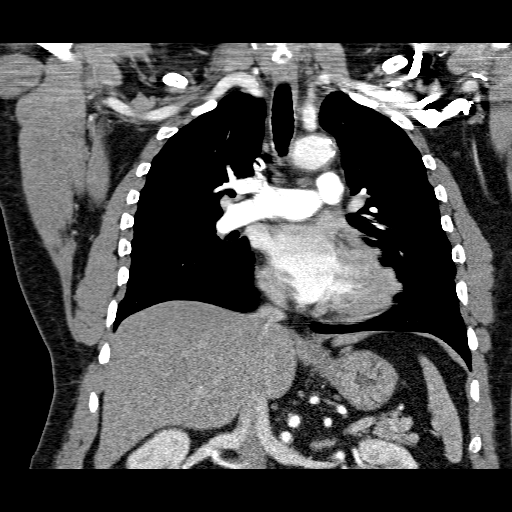

[19 of 36 positions shown; findings below may reference images not displayed]

FINDINGS: Scattered atherosclerotic calcifications aorta and coronary
arteries.

Few aortic valvular calcifications.

Ascending aorta measures 4.2 x 4.3 cm image 46.

No evidence of aortic dissection.

Respiratory motion artifacts at the lower lobes.

Pulmonary arteries grossly patent.

No thoracic adenopathy.

Tiny nonobstructing calculus upper pole right kidney image 102.

Visualized portion of upper abdomen otherwise normal appearance.

Minimal linear subsegmental atelectasis in lingula.

Lungs otherwise clear.

No pleural effusion or pneumothorax.

Osseous structures unremarkable.

Review of the MIP images confirms the above findings.
IMPRESSION: Aneurysmal dilatation of the ascending thoracic aorta up to 4.2 x
4.3 cm.

No other intra thoracic abnormalities.

Tiny nonobstructing calculus at upper pole right kidney.

## 2016-02-18 NOTE — Progress Notes (Signed)
This encounter was created in error - please disregard.
